# Patient Record
Sex: Male | Born: 1995 | Race: White | Hispanic: No | Marital: Single
Health system: Southern US, Community
[De-identification: ages and names within clinical notes are randomized; demographics above are authoritative.]

## PROBLEM LIST (undated history)

## (undated) DIAGNOSIS — R109 Unspecified abdominal pain: Secondary | ICD-10-CM

## (undated) DIAGNOSIS — F419 Anxiety disorder, unspecified: Secondary | ICD-10-CM

## (undated) DIAGNOSIS — K259 Gastric ulcer, unspecified as acute or chronic, without hemorrhage or perforation: Secondary | ICD-10-CM

## (undated) DIAGNOSIS — H539 Unspecified visual disturbance: Secondary | ICD-10-CM

## (undated) DIAGNOSIS — R51 Headache: Secondary | ICD-10-CM

## (undated) DIAGNOSIS — F909 Attention-deficit hyperactivity disorder, unspecified type: Secondary | ICD-10-CM

## (undated) HISTORY — DX: Unspecified abdominal pain: R10.9

## (undated) HISTORY — DX: Gastric ulcer, unspecified as acute or chronic, without hemorrhage or perforation: K25.9

---
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
## 2020-06-05 ENCOUNTER — Other Ambulatory Visit: Payer: Self-pay

## 2020-06-05 ENCOUNTER — Telehealth (HOSPITAL_COMMUNITY): Payer: Medicaid Other | Admitting: Psychiatry

## 2020-07-23 ENCOUNTER — Ambulatory Visit (HOSPITAL_COMMUNITY): Payer: Medicaid Other | Admitting: Psychiatry

## 2020-10-23 ENCOUNTER — Ambulatory Visit (HOSPITAL_COMMUNITY): Payer: Medicaid Other | Admitting: Professional

## 2020-10-23 ENCOUNTER — Other Ambulatory Visit: Payer: Self-pay

## 2020-11-04 ENCOUNTER — Ambulatory Visit (INDEPENDENT_AMBULATORY_CARE_PROVIDER_SITE_OTHER): Payer: Medicaid Other | Admitting: Professional

## 2020-11-04 ENCOUNTER — Other Ambulatory Visit: Payer: Self-pay

## 2020-11-04 DIAGNOSIS — F331 Major depressive disorder, recurrent, moderate: Secondary | ICD-10-CM
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
## 2020-11-26 ENCOUNTER — Other Ambulatory Visit: Payer: Self-pay

## 2020-11-26 ENCOUNTER — Ambulatory Visit (HOSPITAL_COMMUNITY): Payer: Medicaid Other | Admitting: Professional

## 2020-12-11 ENCOUNTER — Ambulatory Visit (HOSPITAL_COMMUNITY): Payer: Medicaid Other | Admitting: Professional

## 2020-12-11 ENCOUNTER — Other Ambulatory Visit: Payer: Self-pay

## 2020-12-11 ENCOUNTER — Telehealth (HOSPITAL_COMMUNITY): Payer: Self-pay | Admitting: Professional

## 2020-12-11 NOTE — Telephone Encounter (Signed)
See call log 

## 2020-12-23 ENCOUNTER — Telehealth (HOSPITAL_COMMUNITY): Payer: Medicaid Other

## 2020-12-23 ENCOUNTER — Other Ambulatory Visit: Payer: Self-pay

## 2020-12-26 ENCOUNTER — Ambulatory Visit (HOSPITAL_COMMUNITY): Payer: Medicaid Other | Admitting: Professional

## 2020-12-26 ENCOUNTER — Telehealth (HOSPITAL_COMMUNITY): Payer: Self-pay | Admitting: Professional

## 2020-12-26 ENCOUNTER — Other Ambulatory Visit: Payer: Self-pay

## 2020-12-26 NOTE — Telephone Encounter (Signed)
See call log
